# Patient Record
Sex: Female | Born: 1965 | Race: White | Hispanic: No | Marital: Single
Health system: Southern US, Community
[De-identification: ages and names within clinical notes are randomized; demographics above are authoritative.]

## PROBLEM LIST (undated history)

## (undated) HISTORY — PX: BACK SURGERY: SHX140

## (undated) HISTORY — PX: REDUCTION MAMMAPLASTY: SUR839

## (undated) HISTORY — PX: KNEE SURGERY: SHX244

## (undated) HISTORY — PX: BREAST SURGERY: SHX581

---

## 2014-05-22 ENCOUNTER — Encounter (HOSPITAL_COMMUNITY): Payer: Self-pay | Admitting: *Deleted

## 2014-05-22 ENCOUNTER — Emergency Department (HOSPITAL_COMMUNITY)
Admission: EM | Admit: 2014-05-22 | Discharge: 2014-05-22 | Disposition: A | Discharge status: Home / Self Care | Payer: PRIVATE HEALTH INSURANCE | Attending: Emergency Medicine | Admitting: Emergency Medicine

## 2014-05-22 DIAGNOSIS — R21 Rash and other nonspecific skin eruption: Secondary | ICD-10-CM | POA: Diagnosis present

## 2014-05-22 DIAGNOSIS — L03211 Cellulitis of face: Secondary | ICD-10-CM

## 2014-05-22 LAB — CBC WITH DIFFERENTIAL/PLATELET
Basophils Absolute: 0 10*3/uL (ref 0.0–0.1)
Basophils Relative: 0 % (ref 0–1)
EOS ABS: 0.1 10*3/uL (ref 0.0–0.7)
EOS PCT: 1 % (ref 0–5)
HEMATOCRIT: 37.4 % (ref 36.0–46.0)
Hemoglobin: 13 g/dL (ref 12.0–15.0)
LYMPHS ABS: 0.9 10*3/uL (ref 0.7–4.0)
LYMPHS PCT: 9 % — AB (ref 12–46)
MCH: 31.4 pg (ref 26.0–34.0)
MCHC: 34.8 g/dL (ref 30.0–36.0)
MCV: 90.3 fL (ref 78.0–100.0)
MONO ABS: 0.2 10*3/uL (ref 0.1–1.0)
Monocytes Relative: 2 % — ABNORMAL LOW (ref 3–12)
Neutro Abs: 9.4 10*3/uL — ABNORMAL HIGH (ref 1.7–7.7)
Neutrophils Relative %: 88 % — ABNORMAL HIGH (ref 43–77)
PLATELETS: 319 10*3/uL (ref 150–400)
RBC: 4.14 MIL/uL (ref 3.87–5.11)
RDW: 12.4 % (ref 11.5–15.5)
WBC: 10.7 10*3/uL — ABNORMAL HIGH (ref 4.0–10.5)

## 2014-05-22 LAB — COMPREHENSIVE METABOLIC PANEL
ALBUMIN: 3.5 g/dL (ref 3.5–5.0)
ALT: 37 U/L (ref 14–54)
ANION GAP: 12 (ref 5–15)
AST: 30 U/L (ref 15–41)
Alkaline Phosphatase: 105 U/L (ref 38–126)
BILIRUBIN TOTAL: 1.1 mg/dL (ref 0.3–1.2)
BUN: 11 mg/dL (ref 6–20)
CHLORIDE: 97 mmol/L — AB (ref 101–111)
CO2: 25 mmol/L (ref 22–32)
Calcium: 8.8 mg/dL — ABNORMAL LOW (ref 8.9–10.3)
Creatinine, Ser: 0.75 mg/dL (ref 0.44–1.00)
GFR calc Af Amer: >60 mL/min (ref >60)
GFR calc non Af Amer: >60 mL/min (ref >60)
Glucose, Bld: 94 mg/dL (ref 70–99)
POTASSIUM: 4 mmol/L (ref 3.5–5.1)
Sodium: 134 mmol/L — ABNORMAL LOW (ref 135–145)
TOTAL PROTEIN: 6.9 g/dL (ref 6.5–8.1)

## 2014-05-22 MED ORDER — HYDROCODONE-ACETAMINOPHEN 5-325 MG PO TABS: 1.0000 | ORAL_TABLET | Freq: Four times a day (QID) | ORAL | Status: DC | PRN

## 2014-05-22 MED ORDER — SODIUM CHLORIDE 0.9 % IV BOLUS (SEPSIS)
1000.0000 mL | Freq: Once | INTRAVENOUS | Status: AC
  Administered 2014-05-22: 1000 mL via INTRAVENOUS

## 2014-05-22 MED ORDER — MORPHINE SULFATE 4 MG/ML IJ SOLN
4.0000 mg | Freq: Once | INTRAMUSCULAR | Status: AC
  Administered 2014-05-22: 4 mg via INTRAVENOUS
  Filled 2014-05-22: qty 1

## 2014-05-22 MED ORDER — SODIUM CHLORIDE 0.9 % IV SOLN
3.0000 g | Freq: Once | INTRAVENOUS | Status: AC
  Administered 2014-05-22: 3 g via INTRAVENOUS
  Filled 2014-05-22: qty 3

## 2014-05-22 MED ORDER — AMOXICILLIN-POT CLAVULANATE 875-125 MG PO TABS: 1.0000 | ORAL_TABLET | Freq: Two times a day (BID) | ORAL | Status: AC

## 2014-05-22 NOTE — ED Notes (Addendum)
Pt reports facial swelling and rash since Sunday. Pt presents with swollen forehead, eyes, nose. Face is red and forehead is peeling. Pt states swelling has progressively tracked down her face. [facility] Pt was given antibiotics, steroids, and pain medication. Pt has not had any relief from the medications. Pt denies any new shampoos, creams, lotions, changes in diet. Pt reports having Botox six weeks ago.

## 2014-05-22 NOTE — ED Notes (Signed)
Pt taking Septra she received from an Urgent Care. Pt states her edema and rash has became worse since Thursday.

## 2014-05-22 NOTE — Discharge Instructions (Signed)
Please switch to Augmentin antibiotic as treatment of facial infection.  Follow up in 2 days for wound recheck.  Return sooner if your condition worsen or if you have other concerns. Stop taking Septra.  Cellulitis Cellulitis is an infection of the skin and the tissue beneath it. The infected area is usually red and tender. Cellulitis occurs most often in the arms and lower legs.  CAUSES  Cellulitis is caused by bacteria that enter the skin through cracks or cuts in the skin. The most common types of bacteria that cause cellulitis are staphylococci and streptococci. SIGNS AND SYMPTOMS   Redness and warmth.  Swelling.  Tenderness or pain.  Fever. DIAGNOSIS  Your health care provider can usually determine what is wrong based on a physical exam. Blood tests may also be done. TREATMENT  Treatment usually involves taking an antibiotic medicine. HOME CARE INSTRUCTIONS   Take your antibiotic medicine as directed by your health care provider. Finish the antibiotic even if you start to feel better.  Keep the infected arm or leg elevated to reduce swelling.  Apply a warm cloth to the affected area up to 4 times per day to relieve pain.  Take medicines only as directed by your health care provider.  Keep all follow-up visits as directed by your health care provider. SEEK MEDICAL CARE IF:   You notice red streaks coming from the infected area.  Your red area gets larger or turns dark in color.  Your bone or joint underneath the infected area becomes painful after the skin has healed.  Your infection returns in the same area or another area.  You notice a swollen bump in the infected area.  You develop new symptoms.  You have a fever. SEEK IMMEDIATE MEDICAL CARE IF:   You feel very sleepy.  You develop vomiting or diarrhea.  You have a general ill feeling (malaise) with muscle aches and pains. MAKE SURE YOU:   Understand these instructions.  Will watch your  condition.  Will get help right away if you are not doing well or get worse. Document Released: 10/14/2004 Document Revised: 05/21/2013 Document Reviewed: 03/22/2011 [facility] Patient Information 2015 [place], [place]. This information is not intended to replace advice given to you by your health care provider. Make sure you discuss any questions you have with your health care provider.  Cellulitis Cellulitis is an infection of the skin and the tissue beneath it. The infected area is usually red and tender. Cellulitis occurs most often in the arms and lower legs.  CAUSES  Cellulitis is caused by bacteria that enter the skin through cracks or cuts in the skin. The most common types of bacteria that cause cellulitis are staphylococci and streptococci. SIGNS AND SYMPTOMS   Redness and warmth.  Swelling.  Tenderness or pain.  Fever. DIAGNOSIS  Your health care provider can usually determine what is wrong based on a physical exam. Blood tests may also be done. TREATMENT  Treatment usually involves taking an antibiotic medicine. HOME CARE INSTRUCTIONS   Take your antibiotic medicine as directed by your health care provider. Finish the antibiotic even if you start to feel better.  Keep the infected arm or leg elevated to reduce swelling.  Apply a warm cloth to the affected area up to 4 times per day to relieve pain.  Take medicines only as directed by your health care provider.  Keep all follow-up visits as directed by your health care provider. SEEK MEDICAL CARE IF:   You notice red  streaks coming from the infected area.  Your red area gets larger or turns dark in color.  Your bone or joint underneath the infected area becomes painful after the skin has healed.  Your infection returns in the same area or another area.  You notice a swollen bump in the infected area.  You develop new symptoms.  You have a fever. SEEK IMMEDIATE MEDICAL CARE IF:   You feel very sleepy.  You  develop vomiting or diarrhea.  You have a general ill feeling (malaise) with muscle aches and pains. MAKE SURE YOU:   Understand these instructions.  Will watch your condition.  Will get help right away if you are not doing well or get worse. Document Released: 10/14/2004 Document Revised: 05/21/2013 Document Reviewed: 03/22/2011 [facility] Patient Information 2015 [place]. This information is not intended to replace advice given to you by your health care provider. Make sure you discuss any questions you have with your health care provider.   Emergency Department Resource Guide 1) Find a Doctor and Pay Out of Pocket Although you won't have to find out who is covered by your insurance plan, it is a good idea to ask around and get recommendations. You will then need to call the office and see if the doctor you have chosen will accept you as a new patient and what types of options they offer for patients who are self-pay. Some doctors offer discounts or will set up payment plans for their patients who do not have insurance, but you will need to ask so you aren't surprised when you get to your appointment.  2) Contact Your Local Health Department Not all health departments have doctors that can see patients for sick visits, but many do, so it is worth a call to see if yours does. If you don't know where your local health department is, you can check in your phone book. The CDC also has a tool to help you locate your state's health department, and many state websites also have listings of all of their local health departments.  3) Find a Walk-in Clinic If your illness is not likely to be very severe or complicated, you may want to try a walk in clinic. These are popping up all over the country in pharmacies, drugstores, and shopping centers. They're usually staffed by nurse practitioners or physician assistants that have been trained to treat common illnesses and complaints. They're usually  fairly quick and inexpensive. However, if you have serious medical issues or chronic medical problems, these are probably not your best option.  No Primary Care Doctor: - Call Health Connect at  [phone] - they can help you locate a primary care doctor that  accepts your insurance, provides certain services, etc. - Physician Referral Service- [phone]  Chronic Pain Problems: Organization         Address  Phone   Notes  [facility]  [phone] Patients need to be referred by their primary care doctor.   Medication Assistance: Organization         Address  Phone   Notes  [facility] Medication [facility] [address] [place], [place] [phone] --Must be a resident of [place] [facility] [place] -- Must have NO insurance coverage whatsoever (no Medicaid/ Medicare, etc.) -- The pt. MUST have a primary care doctor that directs their care regularly and follows them in the community   MedAssist  [phone]   United Way  [phone]  [phone]    Agencies that provide inexpensive medical care: Organization         Address  Phone   Notes  [facility]  [phone]   [facility]    [phone]   [facility] [address] [place], [place] [address] [phone]   [facility] [address] [place], [place] [phone]   Planned Parenthood    [phone]   [facility]    [phone]   [facility]  [address], [place] Phone:  [phone], Fax:  [phone] Hours of Operation:  9 am - 6 pm, M-F.  Also accepts Medicaid/Medicare and self-pay.  [facility]  [address], [place] Phone: [phone], Fax: [phone]. Hours of Operation:  8:30 am - 5:30 pm, M-F.  Also accepts Medicaid and self-pay.  [facility] [place] [address], [place] Phone: [phone]   [facility] [address] Natasha Bence [place], [place] [phone], Ext. [phone] Mondays & Thursdays: 7-9 AM.  First 15 patients are seen on a first come, first serve basis.    Medicaid-accepting [facility] Providers:  Organization         Address  Phone   Notes  [facility] [address], [place] [phone] Also accepts self-pay patients.  [facility] [address] Laurell Josephs [place], [place]  [phone]   [facility] [address], [place] [phone]   [facility] [address] [place] [phone]   Renaye Rakers [address], [place]   [phone] Only accepts [place] patients after they have their name applied to their card.   Self-Pay (no insurance) in [facility]:  Organization         Address  Phone   Notes  Sickle Cell Patients, [facility] [address] [place], [place] [phone]   [facility] [address] [place], [place] [phone]   [facility] [place]  [address], [place] [phone]   [facility]/Dr. Osei-Bonsu  [address] [place] or [address], [place] [phone] Phone number for both [place] and [place] locations is the same.  [facility] Medical and [facility] [address] [place] [phone]   [facility] [address] [place] or [address] [phone]   [facility] [address], [place] [phone], phone; [phone], fax Sees patients 1st and 3rd Saturday of every month.  Must not qualify for public or private insurance (i.e. Medicaid, Medicare, [place] Health Choice, Veterans' Benefits)  Household income should be no more than 200% of the poverty level The clinic cannot treat you if  you are pregnant or think you are pregnant  Sexually transmitted diseases are not treated at the clinic.    Dental Care: Organization         Address  Phone  Notes  [facility] [address] [place], [place] [phone] Accepts children up to age 54 who are enrolled in [place] or [place] Health Choice; pregnant women with a Medicaid card; and children who have  applied for Medicaid or [place] Health Choice, but were declined, whose parents can pay a reduced fee at time of service.  [facility] of [facility]  [address], [place] [phone] Accepts children up to age 49 who are enrolled in [place] or [place] Health Choice; pregnant women with a Medicaid card; and children who have applied for Medicaid or [place] Health Choice, but were declined, whose parents can pay a reduced fee at time of service.  [facility]  [address] [place], [place] [phone] Patients are seen by appointment only. Walk-ins are not accepted. [facility] will see patients 64 years of age and older. Monday - Tuesday (8am-5pm) Most Wednesdays (8:30-5pm) $30 per visit, cash only  [facility] Adult Dental Access PROGRAM  [address], [facility] [phone] Patients are seen by appointment only. Walk-ins are not accepted. [facility] will see patients 28 years of age and older. One Wednesday Evening (Monthly: Volunteer Based).  $30 per visit, cash only  [employer]  [phone] for adults; Children under age 55, call Graduate Pediatric Dentistry at [phone]. Children aged 34-14, please call [phone] to request a pediatric application.  Dental services are provided in all areas of dental care including fillings, crowns and bridges, complete and partial dentures, implants, gum treatment, root canals, and extractions. Preventive care is also provided. Treatment is  provided to both adults and children. Patients are selected via a lottery and there is often a waiting list.   [facility] [address], [place]  [phone] [email or web address]   [facility] [address] [place], [place] [phone], Ext. [phone] Second and Fourth Thursday of each month, opens at 6:30 AM; Clinic ends at 9 AM.  Patients are seen on a first-come first-served basis, and a limited number are seen during each clinic.   [facility]  [address] Ether Griffins [place], [place] [phone]   Eligibility Requirements You must have lived in [place], [place], or [place] counties for at least the last three months.   You cannot be eligible for state or federal sponsored [place], including [employer], [place], or [employer].   You generally cannot be eligible for healthcare insurance through your employer.    How to apply: Eligibility screenings are held every Tuesday and Wednesday afternoon from 1:00 pm until 4:00 pm. You do not need an appointment for the interview!  [facility] [address], [place], [place] [phone]   [facility] Health Department  [phone]   [facility] Health Department  [phone]   [facility] Health Department  [phone]    Behavioral Health Resources in the Community: Intensive Outpatient Programs Organization         Address  Phone  Notes  [facility] [address] [place], [place] [phone]   [facility] Outpatient [address], [place], [place] [phone]   [facility] [address] [place], [place]  [phone]   [facility] Mental Health [address]  [place], [place] [phone] or [phone]   Substance Abuse Resources Organization         Address  Phone  Notes  [facility]  [phone]   [facility]  [phone]   The  [place]  [phone]   Floydene Flock  [phone]   Residential & Outpatient Substance  Abuse Program  [phone]   Psychological Services Organization         Address  Phone  Notes  [facility]  [phone]- [phone]   [facility]  [phone]- [phone]   [facility] [phone], [place] [phone] or [phone]    Mobile Crisis Teams Organization         Address  Phone  Notes  [facility], Mobile Crisis Care Unit  [phone]   [facility]  [address] [place], [place] [phone]   Doristine LocksSharon DeEsch [address] [place] [phone]    Self-Help/Support Groups Organization         Address  Phone             Notes  [facility] [place] - variety of support groups  [phone] Call for more information  Narcotics Anonymous (NA), [facility]aring [facility] [address], [place]  2 meetings at this location   Statisticianesidential Treatment Programs Organization         Address  Phone  Notes  [facility] [address],    [place]  [phone]   [facility]  [address] [place] [phone], [place], [place] [phone]   [facility] [address], [place] [phone] Admissions: 8am-3pm M-F  [facility] [address]    [place], [place] [phone]   [facility] [address], [place], [place] [phone]   [facility] [address]  [place], [place] [phone]   [facility] - Intensive Outpatient [address], [place], [place] [phone]   [facility] ([facility] [address]  [place], [place] [phone] or [phone]   [facility] ([facility]) [address] [place], [place] [phone] Accepts Medicaid  [facility] [address]  [place] [phone] Substance Abuse/Addiction Treatment    [facility] Behavioral Health Resources Organization         Address  Phone  Notes  [facility]  [phone]   Angie FavaJulie Brannon, PhD [address] [place], [place]   [phone] or [phone]) [phone]   [facility]   [address] [place], [place] [phone]   [facility] [address], [place], [place] [phone] Insurance/Medicaid/sponsorship through [facility]  [facility] [address]                                    [place], [place] [phone] Therapy/tele-psych/case  [facility] [address]   [place], [place] [phone]) [phone]    Dr. Lolly MustacheArfeen  [phone]   [facility] [place]  [employer] [facility] 1) [address], [place] 2) [address], [place] 3)  [address], [place] [phone]  [phone]   [facility] Child Abuse Hotline [phone]) [phone] or [phone]) [phone] (After Hours)

## 2014-05-22 NOTE — ED Provider Notes (Signed)
CSN: [id number]     Arrival date & time 05/22/14  1620 History   First MD Initiated Contact with Patient 05/22/14 1705     Chief Complaint  Patient presents with  . Rash  . Facial Swelling     (Consider location/radiation/quality/duration/timing/severity/associated sxs/prior Treatment) HPI   49 year old female resents for evaluation of facial swelling and rash. Patient reports for the past 6 days she has had facial swelling which initially started from her forehead and has gotten progressively worse, spreading down towards her face. It also started initially with some sore throat and painful neck lymph nodes. Follows with facial swelling but no rash. She was seen at urgent care 3 days ago for an evaluation. She was diagnosed with having facial infection and was given antibiotic, Septra, and pain medication. Patient report despite taking the medication the swelling has not improved and now for the past 2 days she has noticed a red rash to her face. Rash is mildly itchy but mostly painful. Persistent. Eyelid swelling causing her vision to be blurred bilaterally. Patient reports she had Botox injection to her forehead 6 weeks ago without any problem. However 3 weeks ago she was scratched by her cat across her forehead and had several skin tear that moment. No significant swelling at that time. She denies any change in soap detergent shampoo lotion diet or medication. At this time she denies any severe headache, neck stiffness, chest pain, shortness of breath, throat swelling, abdominal cramping, nausea vomiting diarrhea. She is currently taking Septra.  History reviewed. No pertinent past medical history. Past Surgical History  Procedure Laterality Date  . Back surgery    . Breast surgery    . Knee surgery     History reviewed. No pertinent family history. History  Substance Use Topics  . Smoking status: Never Smoker   . Smokeless tobacco: Never Used  . Alcohol Use: Yes   OB History    No  data available     Review of Systems  All other systems reviewed and are negative.     Allergies  Review of patient's allergies indicates no known allergies.  Home Medications   Prior to Admission medications   Not on File   BP 126/80 mmHg  Pulse 69  Temp(Src) 98.1 F (36.7 C) (Oral)  Resp 18  Ht 5\' 8"  (1.727 m)  Wt 150 lb (68.04 kg)  BMI 22.81 kg/m2  SpO2 99%  LMP 05/01/2014 (Approximate) Physical Exam  Constitutional: She is oriented to person, place, and time. She appears well-developed and well-nourished. No distress.  Caucasian female, nontoxic in appearance  HENT:  Forehead with scaly skin changes along with moderate erythema throughout forehead expanding to maxilla facial region. Bilateral eyelids are puffy without involvement. Rashes tender, edematous, and warmth consistent with cellulitis.  Eyes: Conjunctivae and EOM are normal. Pupils are equal, round, and reactive to light. Right eye exhibits no discharge. Left eye exhibits no discharge.  Neck: Neck supple.  No nuchal rigidity  Cardiovascular: Normal rate and regular rhythm.   Pulmonary/Chest: Effort normal and breath sounds normal.  Abdominal: Soft. There is no tenderness.  Lymphadenopathy:    She has cervical adenopathy.  Neurological: She is alert and oriented to person, place, and time.  Skin: No rash noted.  Psychiatric: She has a normal mood and affect.  Nursing note and vitals reviewed.   ED Course  Procedures (including critical care time)  Patient with facial swelling and facial rash suggestive of facial cellulitis. Although patient may  have an allergic reaction to Septra, she does not have any other signs of allergic reaction throughout her body aside from the facial rash. No evidence of Stevens-Johnson syndrome.  7:24 PM Patient received a dose of Unasyn in the ED along with IV fluid. She did receive steroid from urgent care prior to arrival. Since being in the ED patient notice improvement of  facial swelling. Labs reassuring. She is afebrile. Plan to have patient discontinue Septra and start taking Augmentin for the next week to 10 days along with pain medication as needed. Patient will need to return in 2 days ago wound recheck and can return sooner if symptoms worsen. Patient voiced understanding and agrees with plan.  Care discussed with Dr. Lynelle DoctorKnapp  Labs Review Labs Reviewed  CBC WITH DIFFERENTIAL/PLATELET - Abnormal; Notable for the following:    WBC 10.7 (*)    Neutrophils Relative % 88 (*)    Neutro Abs 9.4 (*)    Lymphocytes Relative 9 (*)    Monocytes Relative 2 (*)    All other components within normal limits  COMPREHENSIVE METABOLIC PANEL - Abnormal; Notable for the following:    Sodium 134 (*)    Chloride 97 (*)    Calcium 8.8 (*)    All other components within normal limits    Imaging Review No results found.   EKG Interpretation None      MDM   Final diagnoses:  Cellulitis diffuse, face    BP 113/62 mmHg  Pulse 67  Temp(Src) 97.9 F (36.6 C) (Oral)  Resp 17  Ht 5\' 8"  (1.727 m)  Wt 150 lb (68.04 kg)  BMI 22.81 kg/m2  SpO2 100%  LMP 05/01/2014 (Approximate)     Fayrene HelperBowie Solmon Bohr, PA-C 05/22/14 1944  Linwood DibblesJon Knapp, MD 05/22/14 [phone]

## 2014-05-22 NOTE — ED Notes (Signed)
Greta DoomBowie, pa-C, at the bedside.

## 2017-10-21 ENCOUNTER — Other Ambulatory Visit: Payer: Self-pay | Admitting: Internal Medicine

## 2017-10-21 DIAGNOSIS — Z1231 Encounter for screening mammogram for malignant neoplasm of breast: Secondary | ICD-10-CM

## 2017-11-25 ENCOUNTER — Ambulatory Visit
Admission: RE | Admit: 2017-11-25 | Discharge: 2017-11-25 | Disposition: A | Discharge status: Home / Self Care | Payer: 59 | Source: Ambulatory Visit | Attending: Internal Medicine | Admitting: Internal Medicine

## 2017-11-25 DIAGNOSIS — Z1231 Encounter for screening mammogram for malignant neoplasm of breast: Secondary | ICD-10-CM

## 2018-10-17 ENCOUNTER — Other Ambulatory Visit: Payer: Self-pay | Admitting: Internal Medicine

## 2018-10-17 DIAGNOSIS — Z1231 Encounter for screening mammogram for malignant neoplasm of breast: Secondary | ICD-10-CM

## 2018-11-27 ENCOUNTER — Other Ambulatory Visit: Payer: Self-pay

## 2018-11-27 ENCOUNTER — Ambulatory Visit
Admission: RE | Admit: 2018-11-27 | Discharge: 2018-11-27 | Disposition: A | Discharge status: Home / Self Care | Payer: 59 | Source: Ambulatory Visit | Attending: Internal Medicine | Admitting: Internal Medicine

## 2018-11-27 DIAGNOSIS — Z1231 Encounter for screening mammogram for malignant neoplasm of breast: Secondary | ICD-10-CM

## 2019-01-15 ENCOUNTER — Ambulatory Visit: Payer: Managed Care, Other (non HMO) | Attending: Internal Medicine

## 2019-01-15 DIAGNOSIS — Z20822 Contact with and (suspected) exposure to covid-19: Secondary | ICD-10-CM

## 2019-01-16 LAB — NOVEL CORONAVIRUS, NAA: SARS-CoV-2, NAA: NOT DETECTED

## 2019-03-12 ENCOUNTER — Other Ambulatory Visit: Payer: Managed Care, Other (non HMO)

## 2019-03-13 ENCOUNTER — Ambulatory Visit: Payer: Managed Care, Other (non HMO) | Attending: Internal Medicine

## 2019-03-13 DIAGNOSIS — Z20822 Contact with and (suspected) exposure to covid-19: Secondary | ICD-10-CM

## 2019-03-14 LAB — NOVEL CORONAVIRUS, NAA: SARS-CoV-2, NAA: NOT DETECTED

## 2020-11-21 ENCOUNTER — Other Ambulatory Visit: Payer: Self-pay

## 2020-11-21 ENCOUNTER — Emergency Department (HOSPITAL_COMMUNITY)
Admission: EM | Admit: 2020-11-21 | Discharge: 2020-11-21 | Discharge status: Against Medical Advice | Payer: Managed Care, Other (non HMO)

## 2020-11-21 NOTE — ED Notes (Signed)
Patient left on own accord °

## 2021-03-19 IMAGING — MG DIGITAL SCREENING BILAT W/ TOMO W/ CAD
8 series · 8 of 24 positions shown · non-contrast
Comparison: Previous exam(s).

CLINICAL DATA: Screening.

EXAM:
DIGITAL SCREENING BILATERAL MAMMOGRAM WITH TOMO AND CAD

[R CC synth-2D]
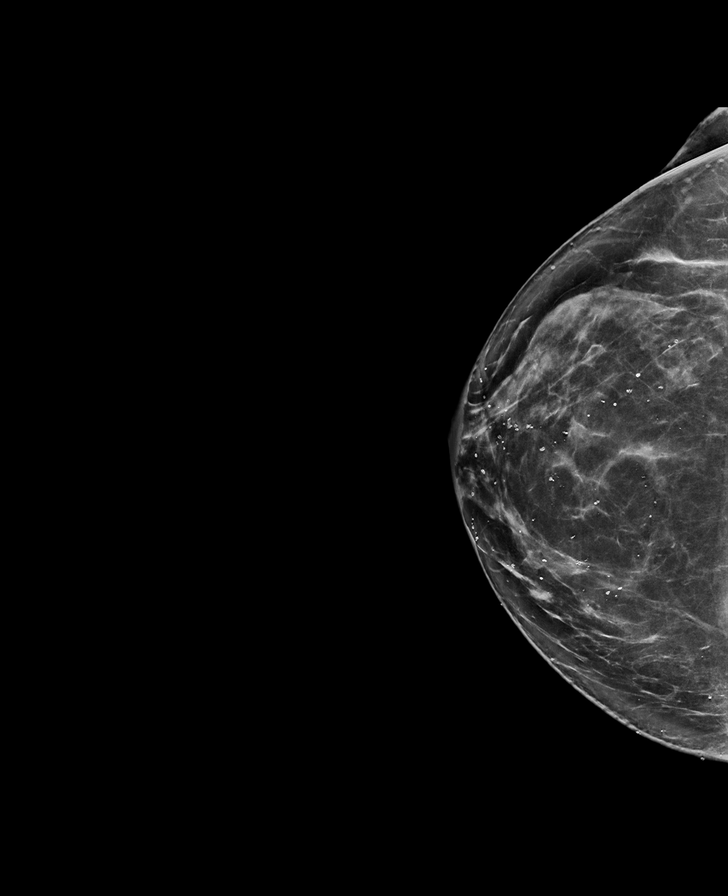

[R MLO synth-2D]
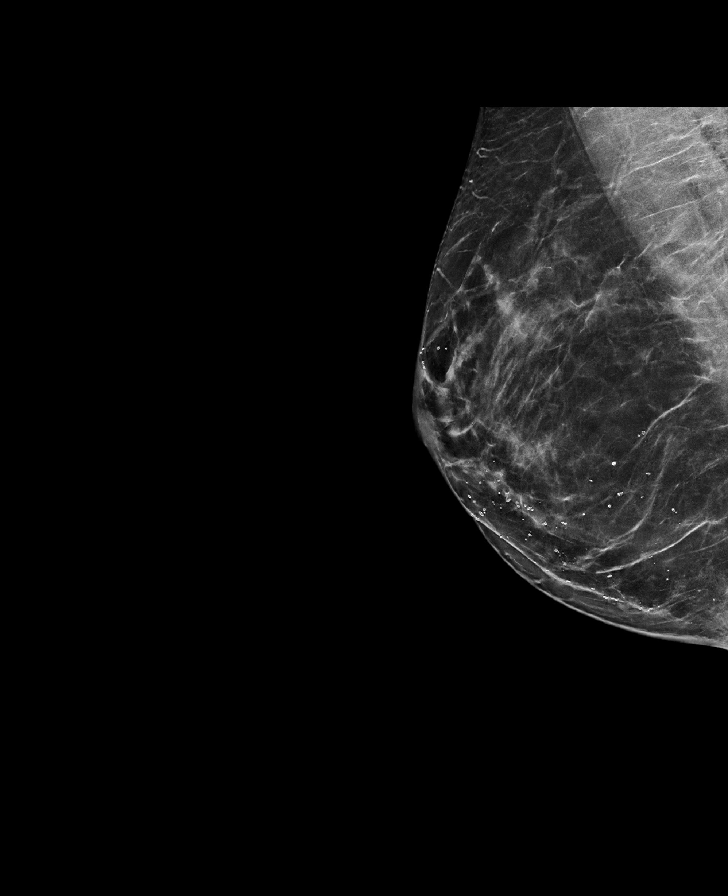

[L MLO synth-2D]
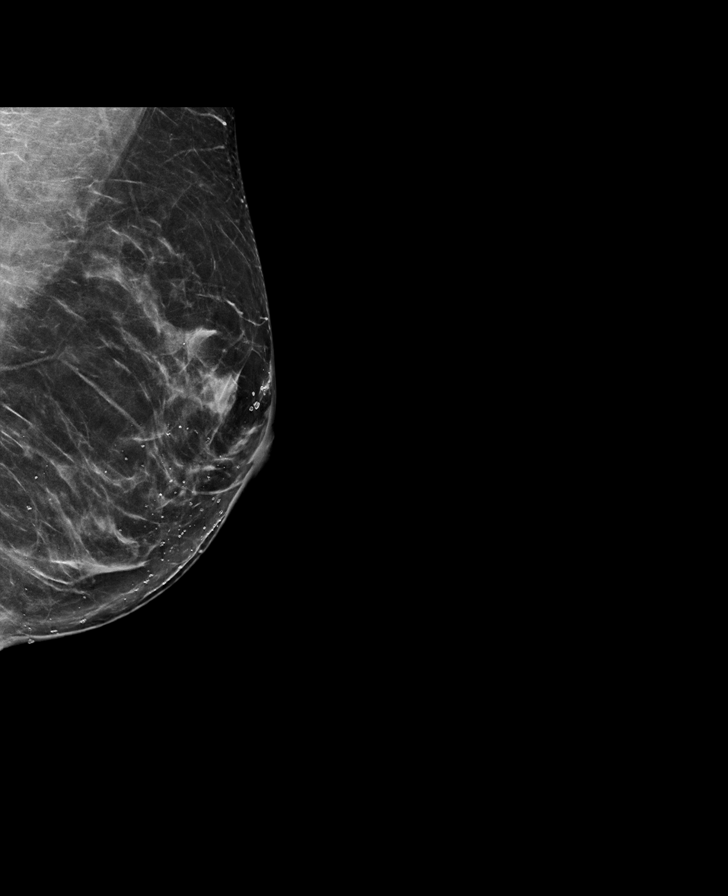

[L CC synth-2D]
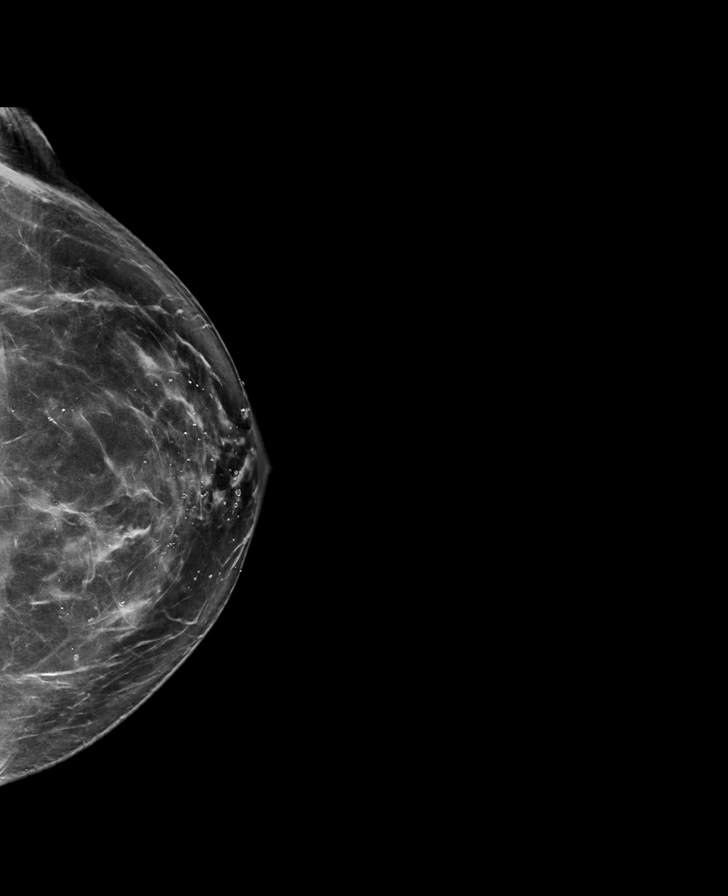

[L CC tomo · tomo slice 42/83.0]
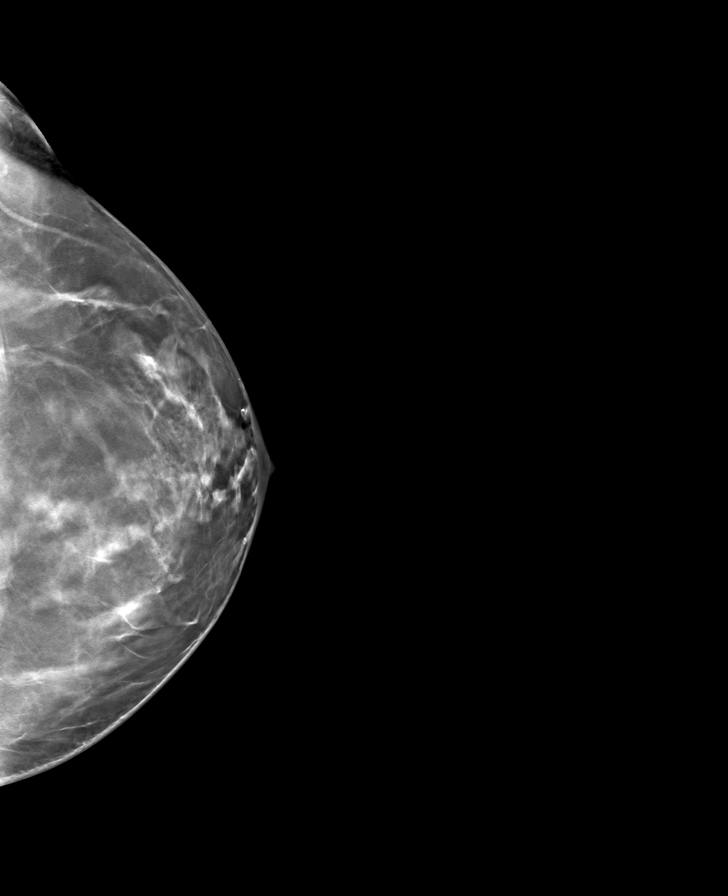

[R MLO tomo · tomo slice 39/78.0]
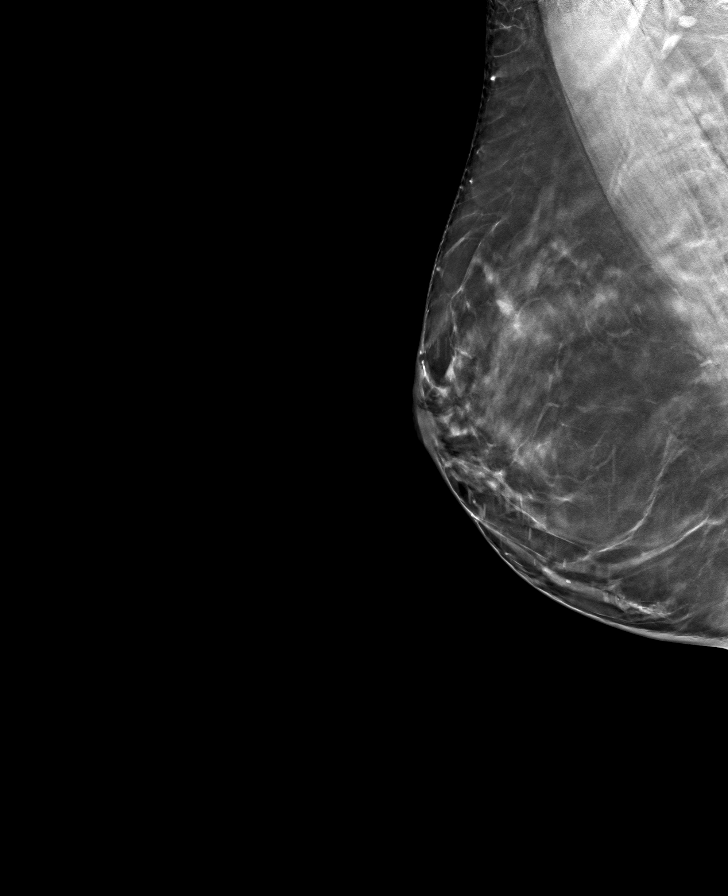

[L MLO tomo · tomo slice 38/75.0]
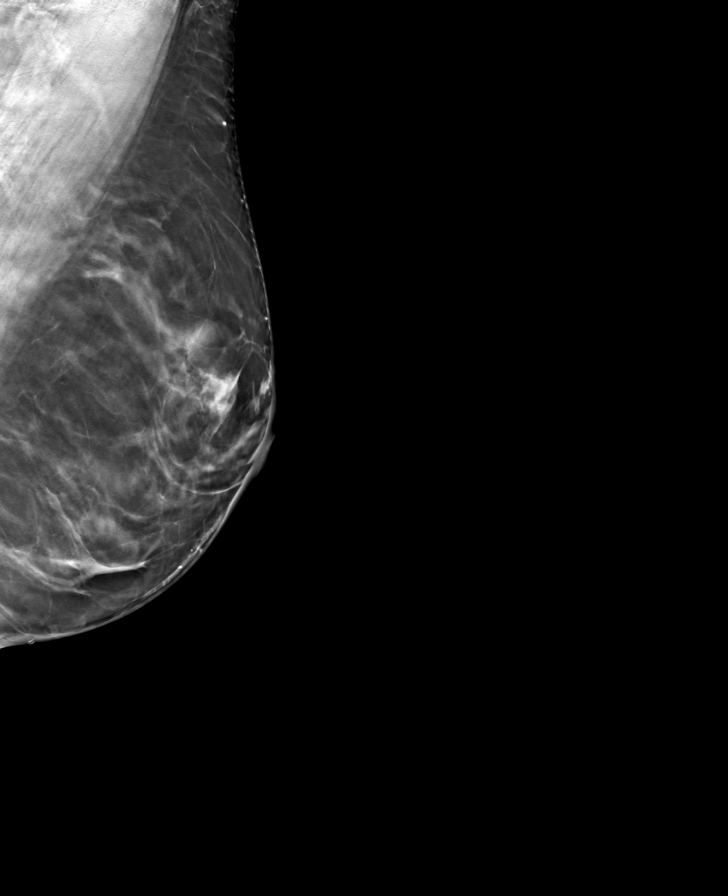

[R CC tomo · tomo slice 40/79.0]
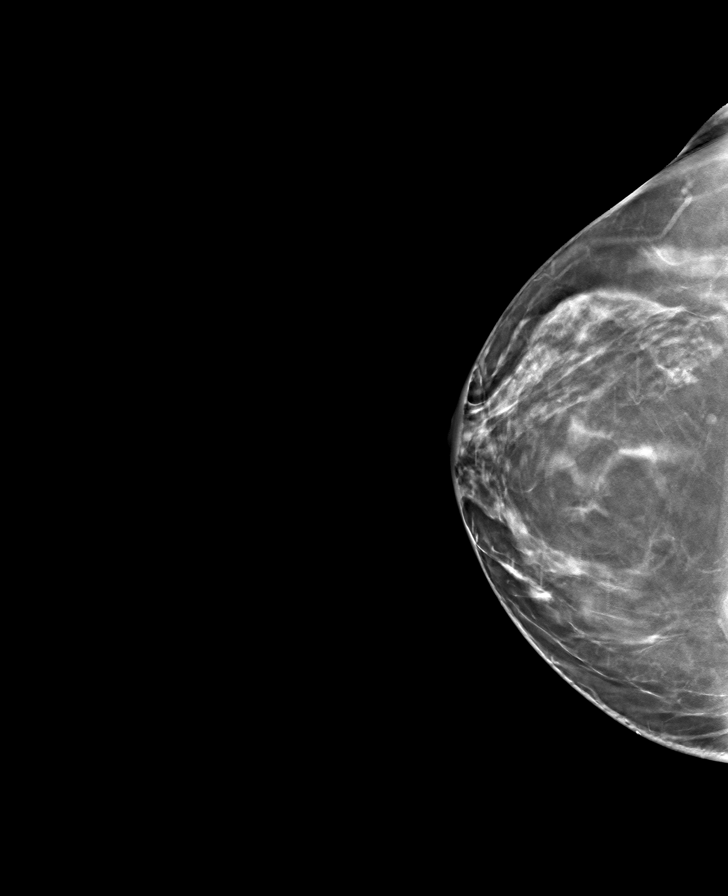

[8 of 24 positions shown; findings below may reference images not displayed]

ACR Breast Density Category b: There are scattered areas of
fibroglandular density.
FINDINGS: There are no findings suspicious for malignancy. Images were
processed with CAD.
IMPRESSION: No mammographic evidence of malignancy. A result letter of this
screening mammogram will be mailed directly to the patient.

RECOMMENDATION:
Screening mammogram in one year. (Code:[id number]-775)

BI-RADS CATEGORY  1: Negative.

## 2022-03-01 ENCOUNTER — Ambulatory Visit (INDEPENDENT_AMBULATORY_CARE_PROVIDER_SITE_OTHER): Payer: Managed Care, Other (non HMO)

## 2022-03-01 ENCOUNTER — Ambulatory Visit: Payer: Managed Care, Other (non HMO) | Admitting: Podiatry

## 2022-03-01 DIAGNOSIS — M7752 Other enthesopathy of left foot: Secondary | ICD-10-CM | POA: Diagnosis not present

## 2022-03-01 DIAGNOSIS — M79672 Pain in left foot: Secondary | ICD-10-CM | POA: Diagnosis not present

## 2022-03-01 NOTE — Progress Notes (Signed)
Subjective:   Patient ID: Christine Bates, female   DOB: 57 y.o.   MRN: [id number]   HPI Chief Complaint  Patient presents with   Foot Problem    Pain located in the left great hallux, pain occurs when putting shoes on, 6 months, no prior injuries     Patient-year-old female presents the office today with above concerns.  The pain started about 6-7 months ago. It only happens when she goes put the shoe on. It does not matter which shoe. It felt like a an open wound. She thought it was a bruise and it would heal. Once the shoe is on it does not bother her. No treatment. No numbness/tingling.   Review of Systems  All other systems reviewed and are negative.  No past medical history on file.  Past Surgical History:  Procedure Laterality Date   BACK SURGERY     BREAST SURGERY     KNEE SURGERY     REDUCTION MAMMAPLASTY Bilateral      Current Outpatient Medications:    amoxicillin-clavulanate (AUGMENTIN) 875-125 MG per tablet, Take 1 tablet by mouth 2 (two) times daily. One po bid x 7 days, Disp: 14 tablet, Rfl: 0   citalopram (CELEXA) 20 MG tablet, Take 20 mg by mouth 2 (two) times daily., Disp: , Rfl:    HYDROcodone-acetaminophen (NORCO/VICODIN) 5-325 MG per tablet, Take 1 tablet by mouth every 6 (six) hours as needed for moderate pain., Disp: 14 tablet, Rfl: 0   meloxicam (MOBIC) 15 MG tablet, Take 15 mg by mouth daily., Disp: , Rfl:    traMADol (ULTRAM) 50 MG tablet, Take 50 mg by mouth every 6 (six) hours as needed for moderate pain., Disp: , Rfl:    zolpidem (AMBIEN) 5 MG tablet, Take 5 mg by mouth at bedtime as needed for sleep., Disp: , Rfl:   No Known Allergies        Objective:  Physical Exam  General: AAO x3, NAD  Dermatological: Skin is warm, dry and supple bilateral.  There are no open sores, no preulcerative lesions, no rash or signs of infection present.  Vascular: Dorsalis Pedis artery and Posterior Tibial artery pedal pulses are 2/4 bilateral with immedate  capillary fill time. There is no pain with calf compression, swelling, warmth, erythema.   Neruologic: Grossly intact via light touch bilateral.   Musculoskeletal: Unable to elicit any area of pinpoint tenderness.  No pain or crepitation.  There is no pain with range of motion.  There is no edema, erythema.  Gait: Unassisted, Nonantalgic.       Assessment:   Small spur first MTPJ     Plan:  -Treatment options discussed including all alternatives, risks, and complications -Etiology of symptoms were discussed -X-rays were obtained and reviewed with the patient.  There is minimal arthritic changes present at the joint and mild spurring is starting to form -I do think the symptoms are coming from the end range of motion of dorsiflexion when bending her toe given x-ray changes that likely was causing her symptoms.  We discussed possible progression of this over time and she has good arch support to help with.  Trula Slade DPM

## 2023-05-11 ENCOUNTER — Other Ambulatory Visit (HOSPITAL_COMMUNITY): Payer: Self-pay | Admitting: Registered Nurse

## 2023-05-11 DIAGNOSIS — E7800 Pure hypercholesterolemia, unspecified: Secondary | ICD-10-CM

## 2024-02-05 ENCOUNTER — Encounter (HOSPITAL_BASED_OUTPATIENT_CLINIC_OR_DEPARTMENT_OTHER): Payer: Self-pay | Admitting: Emergency Medicine

## 2024-02-05 ENCOUNTER — Emergency Department (HOSPITAL_BASED_OUTPATIENT_CLINIC_OR_DEPARTMENT_OTHER): Admitting: Radiology

## 2024-02-05 ENCOUNTER — Emergency Department (HOSPITAL_BASED_OUTPATIENT_CLINIC_OR_DEPARTMENT_OTHER): Admission: EM | Admit: 2024-02-05 | Discharge: 2024-02-05 | Disposition: A | Discharge status: Home / Self Care

## 2024-02-05 DIAGNOSIS — W01198A Fall on same level from slipping, tripping and stumbling with subsequent striking against other object, initial encounter: Secondary | ICD-10-CM | POA: Insufficient documentation

## 2024-02-05 DIAGNOSIS — S29001A Unspecified injury of muscle and tendon of front wall of thorax, initial encounter: Secondary | ICD-10-CM | POA: Diagnosis present

## 2024-02-05 DIAGNOSIS — S20211A Contusion of right front wall of thorax, initial encounter: Secondary | ICD-10-CM | POA: Diagnosis not present

## 2024-02-05 MED ORDER — KETOROLAC TROMETHAMINE 15 MG/ML IJ SOLN
15.0000 mg | Freq: Once | INTRAMUSCULAR | Status: AC
  Administered 2024-02-05: 15 mg via INTRAMUSCULAR
  Filled 2024-02-05: qty 1

## 2024-02-05 MED ORDER — OXYCODONE HCL 5 MG PO TABS
5.0000 mg | ORAL_TABLET | Freq: Once | ORAL | Status: AC
  Administered 2024-02-05: 5 mg via ORAL
  Filled 2024-02-05: qty 1

## 2024-02-05 MED ORDER — OXYCODONE-ACETAMINOPHEN 5-325 MG PO TABS: 1.0000 | ORAL_TABLET | Freq: Four times a day (QID) | ORAL | 0 refills | Status: AC | PRN

## 2024-02-05 MED ORDER — OXYCODONE-ACETAMINOPHEN 5-325 MG PO TABS: 1.0000 | ORAL_TABLET | Freq: Once | ORAL | Status: DC

## 2024-02-05 MED ORDER — NAPROXEN 500 MG PO TABS: 500.0000 mg | ORAL_TABLET | Freq: Two times a day (BID) | ORAL | 0 refills | Status: AC

## 2024-02-05 NOTE — ED Notes (Signed)

## 2024-02-05 NOTE — ED Provider Notes (Signed)
 " [place] [facility] AT [facility] Provider Note   CSN: [id number] Arrival date & time: 02/05/24  9092     Patient presents with: [name]   Christine Bates is a 59 y.o. female.   59 year old female with no reported past medical history presenting to the emergency department today with right sided chest wall pain.  The patient states that she tripped yesterday and fell and struck her right chest wall.  The patient states that she has been having pain there since.  She came to the ER today for further evaluation due to these ongoing symptoms.  She states that she does have some dyspnea and pain with deep breathing with this.  She did take Tylenol  at home prior to coming in today for further evaluation.   Fall Associated symptoms include chest pain.       Prior to Admission medications  Medication Sig Start Date End Date Taking? Authorizing Provider  naproxen  (NAPROSYN ) 500 MG tablet Take 1 tablet (500 mg total) by mouth 2 (two) times daily. 02/05/24  Yes Ula Prentice SAUNDERS, MD  oxyCODONE -acetaminophen  (PERCOCET/ROXICET) 5-325 MG tablet Take 1 tablet by mouth every 6 (six) hours as needed for severe pain (pain score 7-10). 02/05/24  Yes Ula Prentice SAUNDERS, MD  amoxicillin -clavulanate (AUGMENTIN ) 875-125 MG per tablet Take 1 tablet by mouth 2 (two) times daily. One po bid x 7 days 05/22/14   Nivia Colon, PA-C  citalopram (CELEXA) 20 MG tablet Take 20 mg by mouth 2 (two) times daily.    [provider]  zolpidem (AMBIEN) 5 MG tablet Take 5 mg by mouth at bedtime as needed for sleep.    [provider]    Allergies: Patient has no known allergies.    Review of Systems  Cardiovascular:  Positive for chest pain.  All other systems reviewed and are negative.   Updated Vital Signs BP 135/77   Pulse 61   Temp 97.7 F (36.5 C) (Temporal)   Resp 20   Wt 65.8 kg   LMP 11/17/2017   SpO2 98%   BMI 22.05 kg/m   Physical Exam Vitals and nursing note reviewed.    Gen: NAD Eyes: PERRL, EOMI HEENT: no oropharyngeal swelling Neck: trachea midline Resp: clear to auscultation bilaterally, tender over right lateral chest wall Card: RRR, no murmurs, rubs, or gallops Abd: nontender, nondistended Extremities: no calf tenderness, no edema Vascular: 2+ radial pulses bilaterally, 2+ DP pulses bilaterally Skin: no rashes Psyc: acting appropriately   (all labs ordered are listed, but only abnormal results are displayed) Labs Reviewed - No data to display  EKG: None  Radiology: DG Ribs Unilateral W/Chest Right Result Date: 02/05/2024 EXAM: 2 VIEW(S) XRAY OF THE RIGHT RIBS AND CHEST 02/05/2024 09:56:38 AM COMPARISON: None available. CLINICAL HISTORY: R sided chest wall pain FINDINGS: BONES: No acute displaced rib fracture. LUNGS AND PLEURA: No consolidation or pulmonary edema. No pleural effusion or pneumothorax. HEART AND MEDIASTINUM: No acute abnormality of the cardiac and mediastinal silhouettes. IMPRESSION: 1. No acute rib fracture. Electronically signed by: Norleen Boxer MD 02/05/2024 10:25 AM EST RP Workstation: [id number]     Procedures   Medications Ordered in the ED  ketorolac  (TORADOL ) 15 MG/ML injection 15 mg (15 mg Intramuscular Given 02/05/24 1002)  oxyCODONE  (Oxy IR/ROXICODONE ) immediate release tablet 5 mg (5 mg Oral Given 02/05/24 0939)  Medical Decision Making 59 year old female with no reported past medical history presents emergency department today with right-sided chest wall pain.  I will further evaluate the patient here with a chest x-ray to evaluate for rib fractures or pneumothorax.  Will give the patient Toradol  here as well as oxycodone  for pain.  I will reevaluate for ultimate disposition.  She does not have any abdominal tenderness to suggest liver injury at this time.  The patient's symptoms improved with medications here.  Chest x-ray is unremarkable.  Patient be discharged with return  precautions.  I do think that she does have a rib fracture although no large displaced fracture or multiple fractures are noted on x-ray.  Amount and/or Complexity of Data Reviewed Radiology: ordered.  Risk Prescription drug management.        Final diagnoses:  Chest wall contusion, right, initial encounter    ED Discharge Orders          Ordered    naproxen  (NAPROSYN ) 500 MG tablet  2 times daily        02/05/24 1047    oxyCODONE -acetaminophen  (PERCOCET/ROXICET) 5-325 MG tablet  Every 6 hours PRN        02/05/24 1047               Ula Prentice SAUNDERS, MD 02/05/24 1049  "

## 2024-02-05 NOTE — ED Triage Notes (Signed)
 Pt endorse mechanical fall last night when playing with dog. Endorses falling over chair, c/o RT side rib pain. Tylenol  1500mg  pta

## 2024-02-05 NOTE — Discharge Instructions (Addendum)
 Your x-ray did not show any abnormalities but I do think that you have a rib fracture.  Please take the naproxen  twice daily.  If you have other anti-inflammatories such as meloxicam at home you may take this instead but do not take these together.  Take the Percocet as needed for breakthrough pain.  Do not drive or drink alcohol while taking the Percocet as it may make you drowsy.  Return to the ER for worsening symptoms.

## 2024-02-05 NOTE — ED Notes (Signed)
 Vina, RT at beside with incentive spirometry teaching
